# Patient Record
Sex: Male | Born: 1990 | Race: Black or African American | Hispanic: No | Marital: Single | Smoking: Never smoker
Health system: Southern US, Community
[De-identification: ages and names within clinical notes are randomized; demographics above are authoritative.]

---
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
## 2011-08-12 ENCOUNTER — Emergency Department: Payer: Self-pay | Admitting: Emergency Medicine

## 2019-02-10 ENCOUNTER — Encounter: Payer: Self-pay | Admitting: Emergency Medicine

## 2019-02-10 ENCOUNTER — Emergency Department: Payer: Self-pay

## 2019-02-10 ENCOUNTER — Other Ambulatory Visit: Payer: Self-pay

## 2019-02-10 ENCOUNTER — Emergency Department
Admission: EM | Admit: 2019-02-10 | Discharge: 2019-02-10 | Disposition: A | Discharge status: Home / Self Care | Payer: Self-pay | Attending: Emergency Medicine | Admitting: Emergency Medicine

## 2019-02-10 DIAGNOSIS — Y999 Unspecified external cause status: Secondary | ICD-10-CM | POA: Insufficient documentation

## 2019-02-10 DIAGNOSIS — W230XXA Caught, crushed, jammed, or pinched between moving objects, initial encounter: Secondary | ICD-10-CM | POA: Insufficient documentation

## 2019-02-10 DIAGNOSIS — Y939 Activity, unspecified: Secondary | ICD-10-CM | POA: Insufficient documentation

## 2019-02-10 DIAGNOSIS — Y92810 Car as the place of occurrence of the external cause: Secondary | ICD-10-CM | POA: Insufficient documentation

## 2019-02-10 DIAGNOSIS — S6722XA Crushing injury of left hand, initial encounter: Secondary | ICD-10-CM | POA: Insufficient documentation

## 2019-02-10 MED ORDER — LIDOCAINE HCL (PF) 1 % IJ SOLN
5.0000 mL | Freq: Once | INTRAMUSCULAR | Status: AC
  Administered 2019-02-10: 13:00:00 5 mL via INTRADERMAL
  Filled 2019-02-10: qty 5

## 2019-02-10 MED ORDER — OXYCODONE-ACETAMINOPHEN 5-325 MG PO TABS
1.0000 | ORAL_TABLET | Freq: Once | ORAL | Status: AC
  Administered 2019-02-10: 1 via ORAL
  Filled 2019-02-10: qty 1

## 2019-02-10 MED ORDER — TRAMADOL HCL 50 MG PO TABS: 50.0000 mg | ORAL_TABLET | Freq: Four times a day (QID) | ORAL | 0 refills | Status: DC | PRN
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
## 2019-02-10 NOTE — Discharge Instructions (Addendum)
[facility] as [facility] have a [facility] specialist.  Return emergency department worsening.  Keep the area wrapped in the splint.  If you feel that the wound is bleeding through the dressing you may remove and reapply it.  Keep it dry.  Take medications as prescribed

## 2019-05-09 ENCOUNTER — Encounter: Payer: Self-pay | Admitting: Emergency Medicine

## 2019-05-09 ENCOUNTER — Other Ambulatory Visit: Payer: Self-pay

## 2019-05-09 ENCOUNTER — Emergency Department
Admission: EM | Admit: 2019-05-09 | Discharge: 2019-05-09 | Disposition: A | Discharge status: Home / Self Care | Payer: Self-pay | Attending: Student | Admitting: Student

## 2019-05-09 DIAGNOSIS — H1033 Unspecified acute conjunctivitis, bilateral: Secondary | ICD-10-CM | POA: Insufficient documentation

## 2019-05-09 DIAGNOSIS — H60501 Unspecified acute noninfective otitis externa, right ear: Secondary | ICD-10-CM | POA: Insufficient documentation

## 2019-05-09 MED ORDER — AMOXICILLIN-POT CLAVULANATE 875-125 MG PO TABS: 1.0000 | ORAL_TABLET | Freq: Two times a day (BID) | ORAL | 0 refills | Status: DC

## 2019-05-09 MED ORDER — CIPROFLOXACIN HCL 0.3 % OP SOLN
OPHTHALMIC | 1 refills | Status: DC
Start: 2019-05-09 — End: 2019-05-09

## 2019-05-09 MED ORDER — NEOMYCIN-POLYMYXIN-HC 3.5-10000-1 OT SOLN: 3.0000 [drp] | Freq: Three times a day (TID) | OTIC | 0 refills | Status: AC

## 2019-05-09 MED ORDER — CLINDAMYCIN HCL 300 MG PO CAPS
300.0000 mg | ORAL_CAPSULE | Freq: Three times a day (TID) | ORAL | 0 refills | Status: AC
Start: 2019-05-09 — End: 2019-05-19

## 2019-05-09 MED ORDER — CIPROFLOXACIN HCL 0.3 % OP SOLN
1.0000 [drp] | OPHTHALMIC | 1 refills | Status: DC
Start: 2019-05-09 — End: 2019-05-09

## 2019-05-09 MED ORDER — TOBRAMYCIN 0.3 % OP OINT
TOPICAL_OINTMENT | Freq: Two times a day (BID) | OPHTHALMIC | 0 refills | Status: AC
Start: 2019-05-09 — End: 2019-05-19

## 2019-05-09 NOTE — ED Provider Notes (Signed)
[facility] Emergency Department Provider Note  ____________________________________________  Time seen: Approximately 9:01 AM  I have reviewed the triage vital signs and the nursing notes.   HISTORY  Chief Complaint Otalgia and Eye Problem    HPI Jeffery Walter is a 29 y.o. male that presents to the emergency department for evaluation of bilateral eye irritation for 2 days and right ear pain for 1 day. Eyes are draining purulent drainage and were glued shut this morning. Patient does not wear contacts.  No trauma. Patient presents today with his wife and they are expecting their first child.  No headache, dizziness, fever, visual changes, nausea, vomiting.   History reviewed. No pertinent past medical history.  There are no problems to display for this patient.   History reviewed. No pertinent surgical history.  Prior to Admission medications   Medication Sig Start Date End Date Taking? Authorizing Provider  clindamycin (CLEOCIN) 300 MG capsule Take 1 capsule (300 mg total) by mouth 3 (three) times daily for 10 days. 05/09/19 05/19/19  Enid Derry, PA-C  neomycin-polymyxin-hydrocortisone (CORTISPORIN) OTIC solution Place 3 drops into the left ear 3 (three) times daily for 10 days. 05/09/19 05/19/19  Enid Derry, PA-C  tobramycin (TOBREX) 0.3 % ophthalmic ointment Place into the left eye 2 (two) times daily for 10 days. Place a 1/2 inch ribbon of ointment into the lower eyelid. 05/09/19 05/19/19  Enid Derry, PA-C    Allergies Patient has no known allergies.  No family history on file.  Social History Social History   Tobacco Use  . Smoking status: Never Smoker  . Smokeless tobacco: Never Used  Substance Use Topics  . Alcohol use: Not on file  . Drug use: Not on file     Review of Systems  Constitutional: No fever/chills ENT: Positive for ear pain. Gastrointestinal: No abdominal pain.  No nausea, no vomiting.  Musculoskeletal: Negative  for musculoskeletal pain. Skin: Negative for rash, abrasions, lacerations, ecchymosis. Neurological: Negative for headaches   ____________________________________________   PHYSICAL EXAM:  VITAL SIGNS: ED Triage Vitals [05/09/19 0702]  Enc Vitals Group     BP (!) 164/113     Pulse Rate 60     Resp 16     Temp 98.5 F (36.9 C)     Temp Source Oral     SpO2 98 %     Weight 205 lb (93 kg)     Height 6\' 1"  (1.854 m)     Head Circumference      Peak Flow      Pain Score 10     Pain Loc      Pain Edu?      Excl. in GC?      Constitutional: Alert and oriented. Well appearing and in no acute distress. Eyes: Conjunctivae are injected. PERRL. EOMI. White discharge bilaterally. No orbital cellulitis. No hordeolum, cellulitis, subconjunctival hemorrhage, corneal ulcer, hyphema, hypopyon. Head: Atraumatic. ENT:      Ears: Tenderness to palpation of right pinna and tragus. Erythema and swelling to right ear canal.       Nose: No congestion/rhinnorhea.      Mouth/Throat: Mucous membranes are moist.  Neck: No stridor.   Cardiovascular: Good peripheral circulation. Respiratory: Normal respiratory effort without tachypnea or retractions.  Musculoskeletal: Full range of motion to all extremities. No gross deformities appreciated. Neurologic:  Normal speech and language. No gross focal neurologic deficits are appreciated.  Skin:  Skin is warm, dry and intact. No rash noted. Psychiatric:  Mood and affect are normal. Speech and behavior are normal. Patient exhibits appropriate insight and judgement.   ____________________________________________   LABS (all labs ordered are listed, but only abnormal results are displayed)  Labs Reviewed  CHLAMYDIA CULTURE  GC/CHLAMYDIA PROBE AMP   ____________________________________________  EKG   ____________________________________________  RADIOLOGY No results  found.  ____________________________________________    PROCEDURES  Procedure(s) performed:    Procedures    Medications - No data to display   ____________________________________________   INITIAL IMPRESSION / ASSESSMENT AND PLAN / ED COURSE  Pertinent labs & imaging results that were available during my care of the patient were reviewed by me and considered in my medical decision making (see chart for details).  Review of the [place] CSRS was performed in accordance of the [place] prior to dispensing any controlled drugs.   Patient's diagnosis is consistent with conjunctivitis and otitis.  Vital signs and exam are reassuring. Patient denies any visual changes. Gonorrhea and Chlamydia culture was sent. Patient will be discharged home with prescriptions for oral Clindamycin, tobramycin eye drops and cortisporin ear drops. Patient is to follow up with PCP as directed. Patient will return tomorrow if symptoms worsen. Patient is given ED precautions to return to the ED for any worsening or new symptoms.  Jeffery Walter was evaluated in Emergency Department on 05/09/2019 for the symptoms described in the history of present illness. He was evaluated in the context of the global COVID-19 pandemic, which necessitated consideration that the patient might be at risk for infection with the SARS-CoV-2 virus that causes COVID-19. Institutional protocols and algorithms that pertain to the evaluation of patients at risk for COVID-19 are in a state of rapid change based on information released by regulatory bodies including the CDC and federal and state organizations. These policies and algorithms were followed during the patient's care in the ED.   ____________________________________________  FINAL CLINICAL IMPRESSION(S) / ED DIAGNOSES  Final diagnoses:  Acute otitis externa of right ear, unspecified type  Acute bacterial conjunctivitis of both eyes      NEW MEDICATIONS STARTED DURING THIS  VISIT:      This chart was dictated using voice recognition software/Dragon. Despite best efforts to proofread, errors can occur which can change the meaning. Any change was purely unintentional.    Laban Emperor, PA-C 05/09/19 1511    Lilia Pro., MD 05/09/19 [phone]

## 2019-05-09 NOTE — Discharge Instructions (Addendum)
Please begin clindamycin.  Use Cortisporin drops for your ears.  Use Tobrex drops for your eyes.  Please return to emergency department tomorrow if symptoms are worsening.

## 2019-05-09 NOTE — ED Triage Notes (Signed)
bilat ear pain and eye drainage since yeserday.  No fever.

## 2019-05-09 NOTE — ED Notes (Signed)
See triage note  Presents with ear pain and eye drainage  States he is having pain to both ears  On arrival having drainage to both eyes  States having pain when he opens his eyes

## 2019-05-09 NOTE — ED Notes (Signed)
Two separate swabs sent to the lab, one for gonorrhea and one for chlamydia.

## 2019-05-12 LAB — CHLAMYDIA CULTURE: Chlamydia Trachomatis Culture: NEGATIVE

## 2019-05-13 LAB — GC/CHLAMYDIA PROBE AMP

## 2020-01-08 ENCOUNTER — Encounter: Payer: Self-pay | Admitting: Emergency Medicine

## 2020-01-08 ENCOUNTER — Emergency Department
Admission: EM | Admit: 2020-01-08 | Discharge: 2020-01-08 | Disposition: A | Discharge status: Home / Self Care | Payer: Self-pay | Attending: Emergency Medicine | Admitting: Emergency Medicine

## 2020-01-08 ENCOUNTER — Other Ambulatory Visit: Payer: Self-pay

## 2020-01-08 DIAGNOSIS — R519 Headache, unspecified: Secondary | ICD-10-CM | POA: Insufficient documentation

## 2020-01-08 DIAGNOSIS — Z5321 Procedure and treatment not carried out due to patient leaving prior to being seen by health care provider: Secondary | ICD-10-CM | POA: Insufficient documentation

## 2020-01-08 NOTE — ED Notes (Signed)
Called for vital reassessment x 3 with no response 

## 2020-01-08 NOTE — ED Triage Notes (Signed)
  C/O headaches for past two months.  Also wants to be tested for COVID.  AAOx3.  Skin warm and dry. NAD
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
## 2023-01-31 ENCOUNTER — Other Ambulatory Visit: Payer: Self-pay

## 2023-01-31 ENCOUNTER — Emergency Department
Admission: EM | Admit: 2023-01-31 | Discharge: 2023-01-31 | Disposition: A | Discharge status: Home / Self Care | Payer: Self-pay | Attending: Student in an Organized Health Care Education/Training Program | Admitting: Student in an Organized Health Care Education/Training Program

## 2023-01-31 DIAGNOSIS — W268XXA Contact with other sharp object(s), not elsewhere classified, initial encounter: Secondary | ICD-10-CM | POA: Insufficient documentation

## 2023-01-31 DIAGNOSIS — Z23 Encounter for immunization: Secondary | ICD-10-CM | POA: Insufficient documentation

## 2023-01-31 DIAGNOSIS — S01412A Laceration without foreign body of left cheek and temporomandibular area, initial encounter: Secondary | ICD-10-CM | POA: Insufficient documentation

## 2023-01-31 DIAGNOSIS — S0181XA Laceration without foreign body of other part of head, initial encounter: Secondary | ICD-10-CM

## 2023-01-31 MED ORDER — ONDANSETRON HCL 4 MG/2ML IJ SOLN: 4.0000 mg | Freq: Once | INTRAMUSCULAR | Status: AC

## 2023-01-31 MED ORDER — MORPHINE SULFATE (PF) 4 MG/ML IV SOLN
INTRAVENOUS | Status: AC
  Administered 2023-01-31: 4 mg via INTRAVENOUS
  Filled 2023-01-31: qty 1

## 2023-01-31 MED ORDER — BACITRACIN ZINC 500 UNIT/GM EX OINT
TOPICAL_OINTMENT | Freq: Once | CUTANEOUS | Status: AC
  Administered 2023-01-31: 1 via TOPICAL
  Filled 2023-01-31: qty 0.9

## 2023-01-31 MED ORDER — MORPHINE SULFATE (PF) 4 MG/ML IV SOLN: 4.0000 mg | Freq: Once | INTRAVENOUS | Status: AC

## 2023-01-31 MED ORDER — ONDANSETRON HCL 4 MG/2ML IJ SOLN
INTRAMUSCULAR | Status: AC
  Administered 2023-01-31: 4 mg via INTRAVENOUS
  Filled 2023-01-31: qty 2

## 2023-01-31 MED ORDER — TETANUS-DIPHTH-ACELL PERTUSSIS 5-2.5-18.5 LF-MCG/0.5 IM SUSY
0.5000 mL | PREFILLED_SYRINGE | Freq: Once | INTRAMUSCULAR | Status: AC
  Administered 2023-01-31: 0.5 mL via INTRAMUSCULAR
  Filled 2023-01-31: qty 0.5

## 2023-01-31 MED ORDER — CEPHALEXIN 500 MG PO CAPS: 500.0000 mg | ORAL_CAPSULE | Freq: Two times a day (BID) | ORAL | 0 refills | Status: AC
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
## 2023-01-31 NOTE — ED Notes (Signed)
EDP at bedside  
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
## 2023-09-14 ENCOUNTER — Emergency Department: Payer: Self-pay

## 2023-09-14 ENCOUNTER — Encounter: Payer: Self-pay | Admitting: Emergency Medicine

## 2023-09-14 ENCOUNTER — Emergency Department
Admission: EM | Admit: 2023-09-14 | Discharge: 2023-09-14 | Disposition: A | Discharge status: Home / Self Care | Payer: Self-pay | Attending: Emergency Medicine | Admitting: Emergency Medicine

## 2023-09-14 ENCOUNTER — Other Ambulatory Visit: Payer: Self-pay

## 2023-09-14 DIAGNOSIS — K209 Esophagitis, unspecified without bleeding: Secondary | ICD-10-CM | POA: Insufficient documentation

## 2023-09-14 LAB — URINALYSIS, ROUTINE W REFLEX MICROSCOPIC
Bilirubin Urine: NEGATIVE
Glucose, UA: NEGATIVE mg/dL
Hgb urine dipstick: NEGATIVE
Ketones, ur: NEGATIVE mg/dL
Leukocytes,Ua: NEGATIVE
Nitrite: NEGATIVE
Protein, ur: NEGATIVE mg/dL
Specific Gravity, Urine: 1.027 (ref 1.005–1.030)
pH: 5 (ref 5.0–8.0)

## 2023-09-14 LAB — COMPREHENSIVE METABOLIC PANEL WITH GFR
ALT: 12 U/L (ref 0–44)
AST: 17 U/L (ref 15–41)
Albumin: 3.6 g/dL (ref 3.5–5.0)
Alkaline Phosphatase: 74 U/L (ref 38–126)
Anion gap: 11 (ref 5–15)
BUN: 12 mg/dL (ref 6–20)
CO2: 25 mmol/L (ref 22–32)
Calcium: 8.9 mg/dL (ref 8.9–10.3)
Chloride: 98 mmol/L (ref 98–111)
Creatinine, Ser: 0.99 mg/dL (ref 0.61–1.24)
GFR, Estimated: >60 mL/min (ref >60)
Glucose, Bld: 135 mg/dL — ABNORMAL HIGH (ref 70–99)
Potassium: 3.5 mmol/L (ref 3.5–5.1)
Sodium: 134 mmol/L — ABNORMAL LOW (ref 135–145)
Total Bilirubin: 0.5 mg/dL (ref 0.0–1.2)
Total Protein: 7.7 g/dL (ref 6.5–8.1)

## 2023-09-14 LAB — D-DIMER, QUANTITATIVE: D-Dimer, Quant: 1.63 ug{FEU}/mL — ABNORMAL HIGH (ref 0.00–0.50)

## 2023-09-14 LAB — CBC
HCT: 41.2 % (ref 39.0–52.0)
Hemoglobin: 12.9 g/dL — ABNORMAL LOW (ref 13.0–17.0)
MCH: 23.9 pg — ABNORMAL LOW (ref 26.0–34.0)
MCHC: 31.3 g/dL (ref 30.0–36.0)
MCV: 76.3 fL — ABNORMAL LOW (ref 80.0–100.0)
Platelets: 273 K/uL (ref 150–400)
RBC: 5.4 MIL/uL (ref 4.22–5.81)
RDW: 16.1 % — ABNORMAL HIGH (ref 11.5–15.5)
WBC: 5.2 K/uL (ref 4.0–10.5)
nRBC: 0 % (ref 0.0–0.2)

## 2023-09-14 LAB — TROPONIN I (HIGH SENSITIVITY)
Troponin I (High Sensitivity): 3 ng/L (ref <18)
Troponin I (High Sensitivity): <2 ng/L (ref <18)

## 2023-09-14 LAB — LIPASE, BLOOD: Lipase: 37 U/L (ref 11–51)

## 2023-09-14 MED ORDER — LIDOCAINE VISCOUS HCL 2 % MT SOLN
15.0000 mL | Freq: Once | OROMUCOSAL | Status: AC
  Administered 2023-09-14: 15 mL via OROMUCOSAL
  Filled 2023-09-14: qty 15

## 2023-09-14 MED ORDER — ALUM & MAG HYDROXIDE-SIMETH 200-200-20 MG/5ML PO SUSP
30.0000 mL | Freq: Once | ORAL | Status: AC
  Administered 2023-09-14: 30 mL via ORAL
  Filled 2023-09-14: qty 30

## 2023-09-14 MED ORDER — IOHEXOL 350 MG/ML SOLN
75.0000 mL | Freq: Once | INTRAVENOUS | Status: AC | PRN
  Administered 2023-09-14: 75 mL via INTRAVENOUS

## 2023-09-14 MED ORDER — FAMOTIDINE 20 MG PO TABS
20.0000 mg | ORAL_TABLET | Freq: Once | ORAL | Status: AC
  Administered 2023-09-14: 20 mg via ORAL
  Filled 2023-09-14: qty 1

## 2023-09-14 MED ORDER — KETOROLAC TROMETHAMINE 30 MG/ML IJ SOLN
15.0000 mg | Freq: Once | INTRAMUSCULAR | Status: AC
  Administered 2023-09-14: 15 mg via INTRAVENOUS
  Filled 2023-09-14: qty 1

## 2023-09-14 MED ORDER — FAMOTIDINE 20 MG PO TABS: 20.0000 mg | ORAL_TABLET | Freq: Every day | ORAL | 1 refills | Status: AC

## 2023-09-14 NOTE — ED Triage Notes (Signed)
 Patient to ED via POV for right sided abd pain. Started yesterday. Pain worse with movement. Denies N/V/D.

## 2023-09-14 NOTE — ED Provider Notes (Signed)
 [facility] Provider Note    Event Date/Time   First MD Initiated Contact with Patient 09/14/23 2103     (approximate)   History   Abdominal Pain   HPI  Jeffery Walter is a 33 y.o. male who presents to the ED for evaluation of Abdominal Pain   Generally healthy patient presents to the ED with a few days of lower chest pain, pleuritic pain.  He reports a syncopal episode that occurred a couple weeks ago while he was standing outside.  No syncope alongside the pain over the past 2 days.   Physical Exam   Triage Vital Signs: ED Triage Vitals  Encounter Vitals Group     BP 09/14/23 1801 (!) 134/95     Girls Systolic BP Percentile --      Girls Diastolic BP Percentile --      Boys Systolic BP Percentile --      Boys Diastolic BP Percentile --      Pulse Rate 09/14/23 1801 96     Resp 09/14/23 1801 17     Temp 09/14/23 1801 98.3 F (36.8 C)     Temp Source 09/14/23 1801 Oral     SpO2 09/14/23 1801 98 %     Weight 09/14/23 1800 220 lb (99.8 kg)     Height 09/14/23 1800 6' 1 (1.854 m)     Head Circumference --      Peak Flow --      Pain Score 09/14/23 1759 10     Pain Loc --      Pain Education --      Exclude from Growth Chart --     Most recent vital signs: Vitals:   09/14/23 1801  BP: (!) 134/95  Pulse: 96  Resp: 17  Temp: 98.3 F (36.8 C)  SpO2: 98%    General: Awake, no distress.  CV:  Good peripheral perfusion.  Resp:  Normal effort.  Abd:  No distention.  MSK:  No deformity noted.  Neuro:  No focal deficits appreciated. Other:     ED Results / Procedures / Treatments   Labs (all labs ordered are listed, but only abnormal results are displayed) Labs Reviewed  COMPREHENSIVE METABOLIC PANEL WITH GFR - Abnormal; Notable for the following components:      Result Value   Sodium 134 (*)    Glucose, Bld 135 (*)    All other components within normal limits  CBC - Abnormal; Notable for the following components:    Hemoglobin 12.9 (*)    MCV 76.3 (*)    MCH 23.9 (*)    RDW 16.1 (*)    All other components within normal limits  URINALYSIS, ROUTINE W REFLEX MICROSCOPIC - Abnormal; Notable for the following components:   Color, Urine YELLOW (*)    APPearance CLEAR (*)    All other components within normal limits  D-DIMER, QUANTITATIVE - Abnormal; Notable for the following components:   D-Dimer, Quant 1.63 (*)    All other components within normal limits  LIPASE, BLOOD  TROPONIN I (HIGH SENSITIVITY)  TROPONIN I (HIGH SENSITIVITY)    EKG Sinus rhythm with a rate of 93 bpm.  Normal axis and intervals without clear signs of acute ischemia.  No comparison EKG.  RADIOLOGY CTA chest interpreted by me without signs of PE   Official radiology report(s): CT Angio Chest PE W and/or Wo Contrast Result Date: 09/14/2023 CLINICAL DATA:  Pleuritic chest pain and syncope.  Elevated  D-dimer. EXAM: CT ANGIOGRAPHY CHEST WITH CONTRAST TECHNIQUE: Multidetector CT imaging of the chest was performed using the standard protocol during bolus administration of intravenous contrast. Multiplanar CT image reconstructions and MIPs were obtained to evaluate the vascular anatomy. RADIATION DOSE REDUCTION: This exam was performed according to the departmental dose-optimization program which includes automated exposure control, adjustment of the mA and/or kV according to patient size and/or use of iterative reconstruction technique. CONTRAST:  75mL OMNIPAQUE  IOHEXOL  350 MG/ML SOLN COMPARISON:  None Available. FINDINGS: Cardiovascular: There are no filling defects within the pulmonary arteries to suggest pulmonary embolus. The thoracic aorta is normal in caliber. No acute aortic findings. The heart is normal in size. No pericardial effusion. Mediastinum/Nodes: The esophagus is diffusely dilated and fluid-filled with areas of wall thickening in the mid and distal aspect. No mediastinal or hilar adenopathy. No thyroid nodule. Lungs/Pleura:  Dependent and subsegmental atelectasis within both lower lobes. No confluent airspace disease. No pleural effusion. The trachea and central airways are clear. Upper Abdomen: 10 mm left adrenal nodule, Hounsfield units of 25. Punctate nonobstructing right renal stone. No acute upper abdominal findings. Musculoskeletal: There are no acute or suspicious osseous abnormalities. Review of the MIP images confirms the above findings. IMPRESSION: 1. No pulmonary embolus. 2. Diffusely dilated and fluid-filled esophagus with areas of wall thickening in the mid and distal aspect. Findings may represent esophagitis, however underlying esophageal mass is not excluded. Recommend GI consultation and endoscopy for further evaluation. 3. Dependent and subsegmental atelectasis within both lower lobes. 4. Left adrenal mass measuring 1 cm, probable benign adenoma. Recommend follow-up adrenal washout CT in 1 year. If stable for = 1 year, no further follow-up imaging. JACR 2017 Aug; 14(8):1038-44, JCAT 2016 Mar-Apr; 40(2):194-200, Urol J 2006 Spring; 3(2):71-4. 5. Punctate nonobstructing right renal stone. Electronically Signed   By: Andrea Gasman M.D.   On: 09/14/2023 22:56    PROCEDURES and INTERVENTIONS:  .1-3 Lead EKG Interpretation  Performed by: Claudene Rover, MD Authorized by: Claudene Rover, MD     Interpretation: normal     ECG rate:  90   ECG rate assessment: normal     Rhythm: sinus rhythm     Ectopy: none     Conduction: normal     Medications  ketorolac  (TORADOL ) 30 MG/ML injection 15 mg (15 mg Intravenous Given 09/14/23 2128)  iohexol  (OMNIPAQUE ) 350 MG/ML injection 75 mL (75 mLs Intravenous Contrast Given 09/14/23 2218)  alum & mag hydroxide-simeth (MAALOX/MYLANTA) 200-200-20 MG/5ML suspension 30 mL (30 mLs Oral Given 09/14/23 2314)  lidocaine  (XYLOCAINE ) 2 % viscous mouth solution 15 mL (15 mLs Mouth/Throat Given 09/14/23 2314)  famotidine  (PEPCID ) tablet 20 mg (20 mg Oral Given 09/14/23 2314)      IMPRESSION / MDM / ASSESSMENT AND PLAN / ED COURSE  I reviewed the triage vital signs and the nursing notes.  Differential diagnosis includes, but is not limited to, ACS, PTX, PNA, muscle strain/spasm, PE, dissection, anxiety, pleural effusion  {Patient presents with symptoms of an acute illness or injury that is potentially life-threatening.  Patient presents with atypical chest discomfort, possibly due to esophagitis and with an otherwise benign workup, suitable for outpatient management.  Nonischemic EKG, negative troponin but positive D-dimer.  Normal CBC, lipase and metabolic panel.  Normal LFTs.  CTA chest without PE, pneumonia but does demonstrate signs of esophagitis.  Suitable for outpatient management  Clinical Course as of 09/14/23 2315  Tue Sep 14, 2023  2314 Updated patient of reassuring CT scan with regards to  PE.  We discussed signs of esophagitis, starting an H2 blocker, GI referral, ED return precautions. [DS]    Clinical Course User Index [DS] Claudene Rover, MD     FINAL CLINICAL IMPRESSION(S) / ED DIAGNOSES   Final diagnoses:  Esophagitis     Rx / DC Orders   ED Discharge Orders          Ordered    famotidine  (PEPCID ) 20 MG tablet  Daily        09/14/23 2314             Note:  This document was prepared using Dragon voice recognition software and may include unintentional dictation errors.   Claudene Rover, MD 09/14/23 [phone]

## 2023-09-14 NOTE — Discharge Instructions (Signed)
 As we discussed, signs of likely acid reflux irritating your esophagus (esophagitis).  Please start taking acid suppression medicine every day, famotidine /Pepcid .   Can also use as needed medications like Tums or Rolaids on top of this.  Call the number for the GI doctor to be seen in the clinic and discuss doing an endoscopy to better look at this area  Return to the ED with any worsening symptoms
